# Patient Record
Sex: Male | Born: 1975 | Race: Black or African American | Hispanic: No | Marital: Single | Smoking: Never smoker
Health system: Southern US, Community
[De-identification: ages and names within clinical notes are randomized; demographics above are authoritative.]

## PROBLEM LIST (undated history)

## (undated) DIAGNOSIS — I639 Cerebral infarction, unspecified: Secondary | ICD-10-CM

## (undated) HISTORY — PX: ABDOMINAL SURGERY: SHX537

---

## 2001-09-25 ENCOUNTER — Encounter: Payer: Self-pay | Admitting: Emergency Medicine

## 2001-09-25 ENCOUNTER — Emergency Department (HOSPITAL_COMMUNITY): Admission: EM | Admit: 2001-09-25 | Discharge: 2001-09-25 | Payer: Self-pay | Admitting: Emergency Medicine

## 2003-05-20 ENCOUNTER — Emergency Department (HOSPITAL_COMMUNITY): Admission: EM | Admit: 2003-05-20 | Discharge: 2003-05-21 | Payer: Self-pay | Admitting: Emergency Medicine

## 2003-05-22 ENCOUNTER — Emergency Department (HOSPITAL_COMMUNITY): Admission: EM | Admit: 2003-05-22 | Discharge: 2003-05-22 | Payer: Self-pay

## 2003-05-25 ENCOUNTER — Emergency Department (HOSPITAL_COMMUNITY): Admission: EM | Admit: 2003-05-25 | Discharge: 2003-05-25 | Payer: Self-pay | Admitting: Emergency Medicine

## 2003-07-11 ENCOUNTER — Inpatient Hospital Stay (HOSPITAL_COMMUNITY): Admission: EM | Admit: 2003-07-11 | Discharge: 2003-07-17 | Payer: Self-pay | Admitting: Emergency Medicine

## 2003-07-16 ENCOUNTER — Encounter: Payer: Self-pay | Admitting: Surgery

## 2004-02-25 ENCOUNTER — Emergency Department (HOSPITAL_COMMUNITY): Admission: EM | Admit: 2004-02-25 | Discharge: 2004-02-25 | Payer: Self-pay | Admitting: Emergency Medicine

## 2018-01-22 ENCOUNTER — Other Ambulatory Visit: Payer: Self-pay

## 2018-01-22 ENCOUNTER — Emergency Department (HOSPITAL_COMMUNITY)

## 2018-01-22 ENCOUNTER — Encounter (HOSPITAL_COMMUNITY): Payer: Self-pay

## 2018-01-22 ENCOUNTER — Emergency Department (HOSPITAL_COMMUNITY)
Admission: EM | Admit: 2018-01-22 | Discharge: 2018-01-22 | Disposition: A | Discharge status: Home / Self Care | Attending: Emergency Medicine | Admitting: Emergency Medicine

## 2018-01-22 DIAGNOSIS — Y990 Civilian activity done for income or pay: Secondary | ICD-10-CM | POA: Insufficient documentation

## 2018-01-22 DIAGNOSIS — S29011A Strain of muscle and tendon of front wall of thorax, initial encounter: Secondary | ICD-10-CM | POA: Insufficient documentation

## 2018-01-22 DIAGNOSIS — S299XXA Unspecified injury of thorax, initial encounter: Secondary | ICD-10-CM | POA: Diagnosis present

## 2018-01-22 DIAGNOSIS — X501XXA Overexertion from prolonged static or awkward postures, initial encounter: Secondary | ICD-10-CM | POA: Insufficient documentation

## 2018-01-22 DIAGNOSIS — Y9259 Other trade areas as the place of occurrence of the external cause: Secondary | ICD-10-CM | POA: Insufficient documentation

## 2018-01-22 DIAGNOSIS — R0602 Shortness of breath: Secondary | ICD-10-CM | POA: Insufficient documentation

## 2018-01-22 DIAGNOSIS — Y9389 Activity, other specified: Secondary | ICD-10-CM | POA: Insufficient documentation

## 2018-01-22 MED ORDER — LIDOCAINE 5 % EX PTCH: 1.0000 | MEDICATED_PATCH | CUTANEOUS | 0 refills | Status: DC

## 2018-01-22 MED ORDER — METHOCARBAMOL 500 MG PO TABS: 500.0000 mg | ORAL_TABLET | Freq: Two times a day (BID) | ORAL | 0 refills | Status: DC

## 2018-01-22 MED ORDER — IBUPROFEN 600 MG PO TABS: 600.0000 mg | ORAL_TABLET | Freq: Four times a day (QID) | ORAL | 0 refills | Status: AC | PRN

## 2018-01-22 NOTE — ED Provider Notes (Signed)
[facility]-EMERGENCY DEPT Provider Note   CSN: [id number] Arrival date & time: 01/22/18  [id number]     History   Chief Complaint Chief Complaint  Patient presents with  . Rib Injury    Left  . Shortness of Breath    HPI Gerald Roy is a 42 y.o. male.  HPI   Gerald Roy is a 42 y.o. male, patient with no pertinent past medical history, presenting to the ED with left-sided rib pain that began shortly prior to arrival.  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  He was at work, leaning over a rail to pick up an object, felt a "pop" in the left ribs, and experienced immediate pain.  He complained of shortness of breath to the triage nurse, but he clarifies that he hesitates to take a deep breath due to his pain. Denies dizziness, hemoptysis, cough, falls, direct trauma, abdominal pain, nausea/vomiting, or any other complaints.   History reviewed. No pertinent past medical history.  There are no active problems to display for this patient.   Past Surgical History:  Procedure Laterality Date  . ABDOMINAL SURGERY         Home Medications    Prior to Admission medications   Medication Sig Start Date End Date Taking? Authorizing Provider  ibuprofen (ADVIL,MOTRIN) 600 MG tablet Take 1 tablet (600 mg total) by mouth every 6 (six) hours as needed. 01/22/18   Jennaya Pogue C, PA-C  lidocaine (LIDODERM) 5 % Place 1 patch onto the skin daily. Remove & Discard patch within 12 hours or as directed by MD 01/22/18   Harolyn RutherfordJoy, Charlisa Cham C, PA-C  methocarbamol (ROBAXIN) 500 MG tablet Take 1 tablet (500 mg total) by mouth 2 (two) times daily. 01/22/18   Anselm PancoastJoy, Aleecia Tapia C, PA-C    Family History History reviewed. No pertinent family history.  Social History Social History   Tobacco Use  . Smoking status: Never Smoker  . Smokeless tobacco: Never Used  Substance Use Topics  . Alcohol use: Not on file  . Drug use: No     Allergies   Sulfa antibiotics   Review of  Systems Review of Systems  Respiratory: Negative for cough and shortness of breath.   Gastrointestinal: Negative for abdominal pain, nausea and vomiting.  Musculoskeletal: Negative for back pain.       Left rib pain  All other systems reviewed and are negative.    Physical Exam Updated Vital Signs BP 114/78 (BP Location: Right Arm)   Pulse 78   Temp 97.7 F (36.5 C) (Oral)   Resp 18   Ht 6\' 3"  (1.905 m)   Wt 98.4 kg (217 lb)   SpO2 99%   BMI 27.12 kg/m   Physical Exam  Constitutional: He appears well-developed and well-nourished. No distress.  HENT:  Head: Normocephalic and atraumatic.  Eyes: Conjunctivae are normal.  Neck: Neck supple.  Cardiovascular: Normal rate, regular rhythm, normal heart sounds and intact distal pulses.  Pulmonary/Chest: Effort normal and breath sounds normal. No respiratory distress. He has no decreased breath sounds.  Tenderness to left anterior lateral ribs without noted deformity, crepitus, instability, bruising, or swelling.  Equal chest rise and fall. Patient does have apparent pain with any movement of the torso.    Abdominal: Soft. There is no tenderness. There is no guarding.  Musculoskeletal: He exhibits no edema.  Lymphadenopathy:    He has no cervical adenopathy.  Neurological: He is alert.  Skin: Skin is warm and  dry. He is not diaphoretic.  Psychiatric: He has a normal mood and affect. His behavior is normal.  Nursing note and vitals reviewed.    ED Treatments / Results  Labs (all labs ordered are listed, but only abnormal results are displayed) Labs Reviewed - No data to display  EKG  EKG Interpretation  Date/Time:  Tuesday January 22 2018 02:55:43 EST Ventricular Rate:  82 PR Interval:    QRS Duration: 90 QT Interval:  361 QTC Calculation: 422 R Axis:   81 Text Interpretation:  Sinus rhythm No significant change since last tracing other than rate is slower Confirmed by Rochele Raring [phone]) on 01/22/2018 3:01:20 AM        Radiology Dg Chest 2 View  Result Date: 01/22/2018 CLINICAL DATA:  42 year old male with shortness of breath. EXAM: CHEST  2 VIEW COMPARISON:  None. FINDINGS: The heart size and mediastinal contours are within normal limits. Both lungs are clear. The visualized skeletal structures are unremarkable. IMPRESSION: No active cardiopulmonary disease. Electronically Signed   By: Elgie Collard M.D.   On: 01/22/2018 03:23    Procedures Procedures (including critical care time)  Medications Ordered in ED Medications - No data to display   Initial Impression / Assessment and Plan / ED Course  I have reviewed the triage vital signs and the nursing notes.  Pertinent labs & imaging results that were available during my care of the patient were reviewed by me and considered in my medical decision making (see chart for details).     Patient presents with left rib pain.  No acute abnormalities on chest x-ray.  Equal chest rise and fall.  Lung sounds clear and equal.  Maintains adequate SPO2 on room air.  No tachycardia.  No signs of distress.  Low suspicion for lung injury based on my findings during this visit.  Pain management and PCP follow-up for any further evaluation. The patient was given instructions for home care as well as return precautions. Patient voices understanding of these instructions, accepts the plan, and is comfortable with discharge.  Vitals:   01/22/18 0254 01/22/18 0258 01/22/18 0508  BP: 129/80  114/78  Pulse: 83  78  Resp: 16  18  Temp: 97.7 F (36.5 C)    TempSrc: Oral    SpO2: 96%  99%  Weight:  98.4 kg (217 lb)   Height:  6\' 3"  (1.905 m)      Final Clinical Impressions(s) / ED Diagnoses   Final diagnoses:  Intercostal muscle strain, initial encounter    ED Discharge Orders        Ordered    ibuprofen (ADVIL,MOTRIN) 600 MG tablet  Every 6 hours PRN     01/22/18 0453    methocarbamol (ROBAXIN) 500 MG tablet  2 times daily     01/22/18 0453     lidocaine (LIDODERM) 5 %  Every 24 hours     01/22/18 0453       Anselm Pancoast, PA-C 01/22/18 0610    Ward, Layla Maw, DO 01/22/18 [phone]

## 2018-01-22 NOTE — ED Triage Notes (Signed)
Pt reports feeling a "pop" in his left rib cage area while reaching over a rail for an object at work. Pt reports left side rib cage pain and sob. Pt denies hx of smoking. Pt A+OX4, NAD, speaking in complete sentences, ambulatory to triage.

## 2018-01-22 NOTE — ED Notes (Signed)
Bed: WLPT2 Expected date:  Expected time:  Means of arrival:  Comments: 

## 2018-01-22 NOTE — Discharge Instructions (Signed)
Expect your soreness to increase over the next 2-3 days. Take it easy, but do not lay around too much as this may make any stiffness worse.  Antiinflammatory medications: Take 600 mg of ibuprofen every 6 hours or 440 mg (over the counter dose) to 500 mg (prescription dose) of naproxen every 12 hours for the next 3 days. After this time, these medications may be used as needed for pain. Take these medications with food to avoid upset stomach. Choose only one of these medications, do not take them together.  Tylenol: Should you continue to have additional pain while taking the ibuprofen or naproxen, you may add in tylenol as needed. Your daily total maximum amount of tylenol from all sources should be limited to 4000mg /day for persons without liver problems, or 2000mg /day for those with liver problems. Muscle relaxer: Robaxin is a muscle relaxer and may help loosen stiff muscles. Do not take the Robaxin while driving or performing other dangerous activities.  Lidocaine patches: These are available via either prescription or over-the-counter. The over-the-counter option may be more economical one and are likely just as effective. There are multiple over-the-counter brands, such as Salonpas. Exercises: Be sure to perform the attached exercises starting with three times a week and working up to performing them daily. This is an essential part of preventing long term problems.  Incentive spirometer: Please use the incentive spirometer about every hour or 2 throughout the day.  Have an initial goal of 1000 mL and then increase from there.  Follow up with a primary care provider for any future management of these complaints.

## 2018-09-21 IMAGING — CR DG CHEST 2V
3 series · 3 of 3 positions shown · non-contrast
Comparison: None.

CLINICAL DATA: 41-year-old male with shortness of breath.

EXAM:
CHEST  2 VIEW

[w chest pa (1 of 2)]
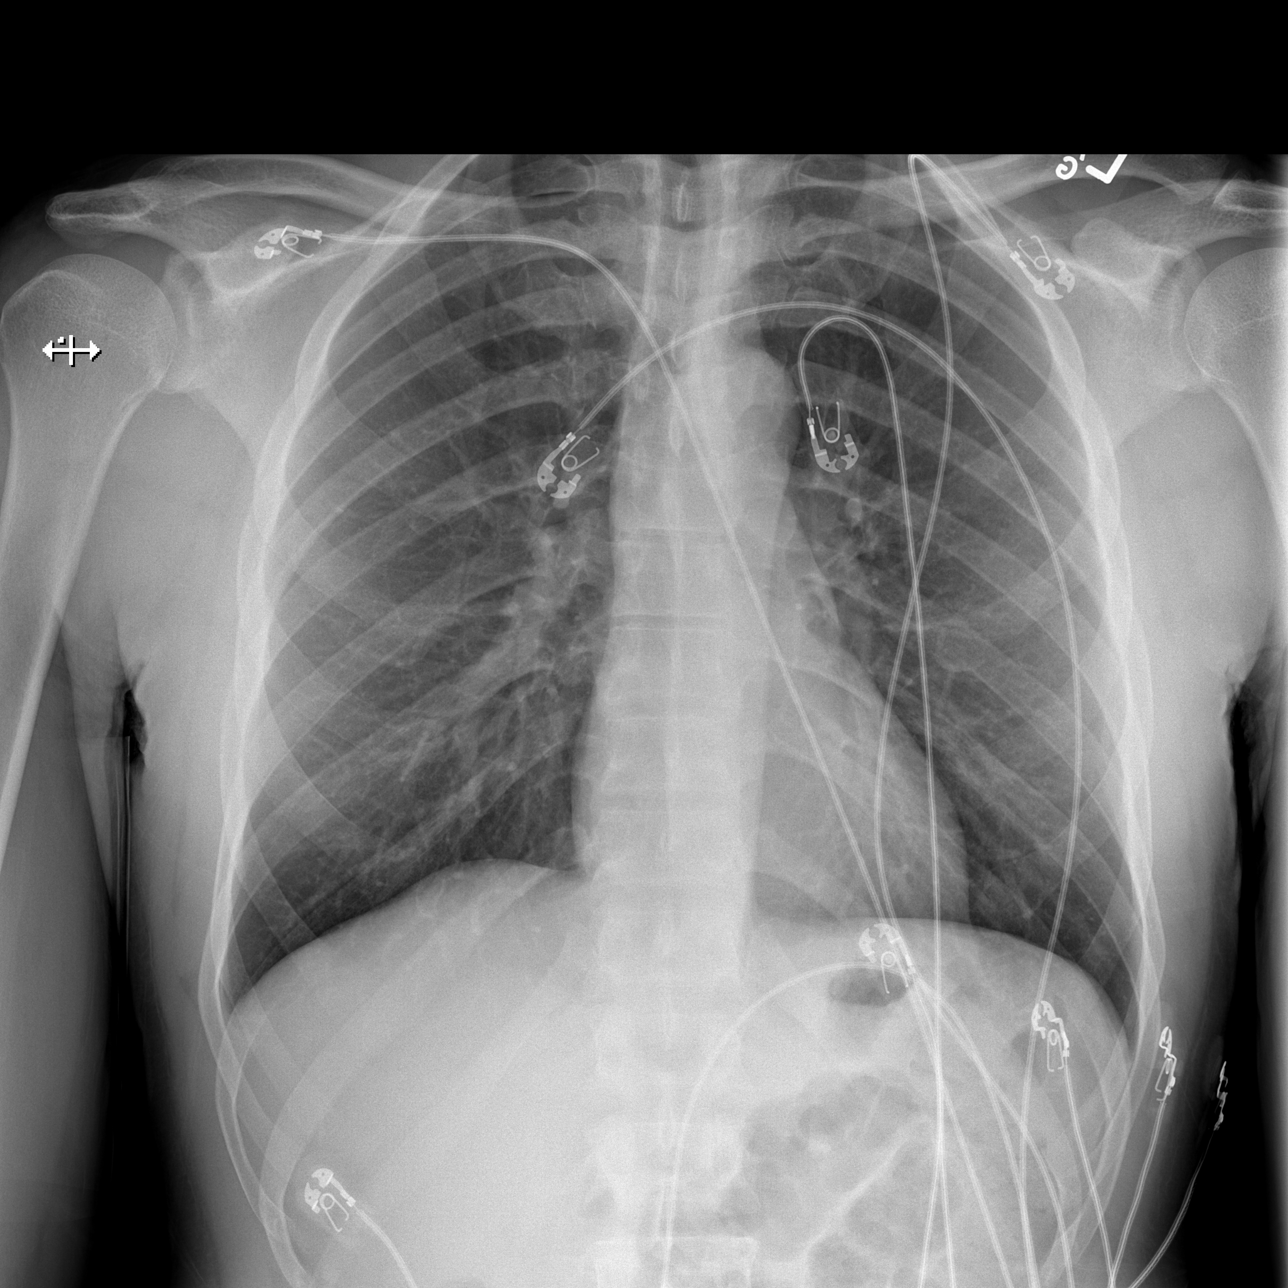

[w chest pa (2 of 2)]
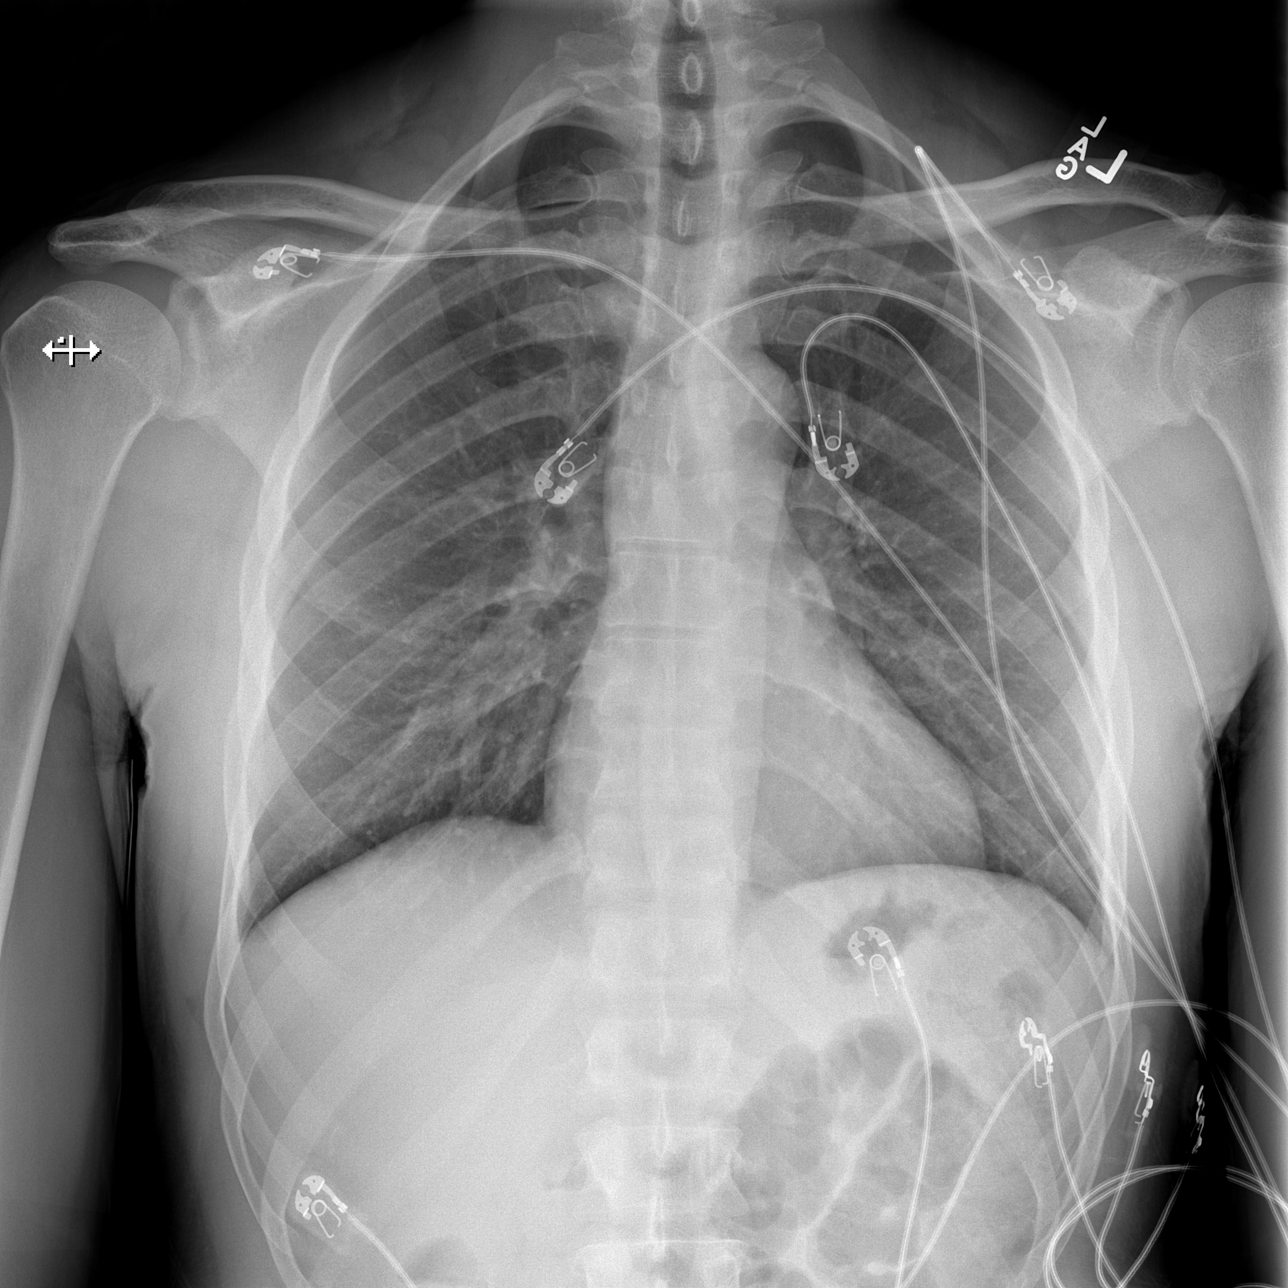

[w chest lat]
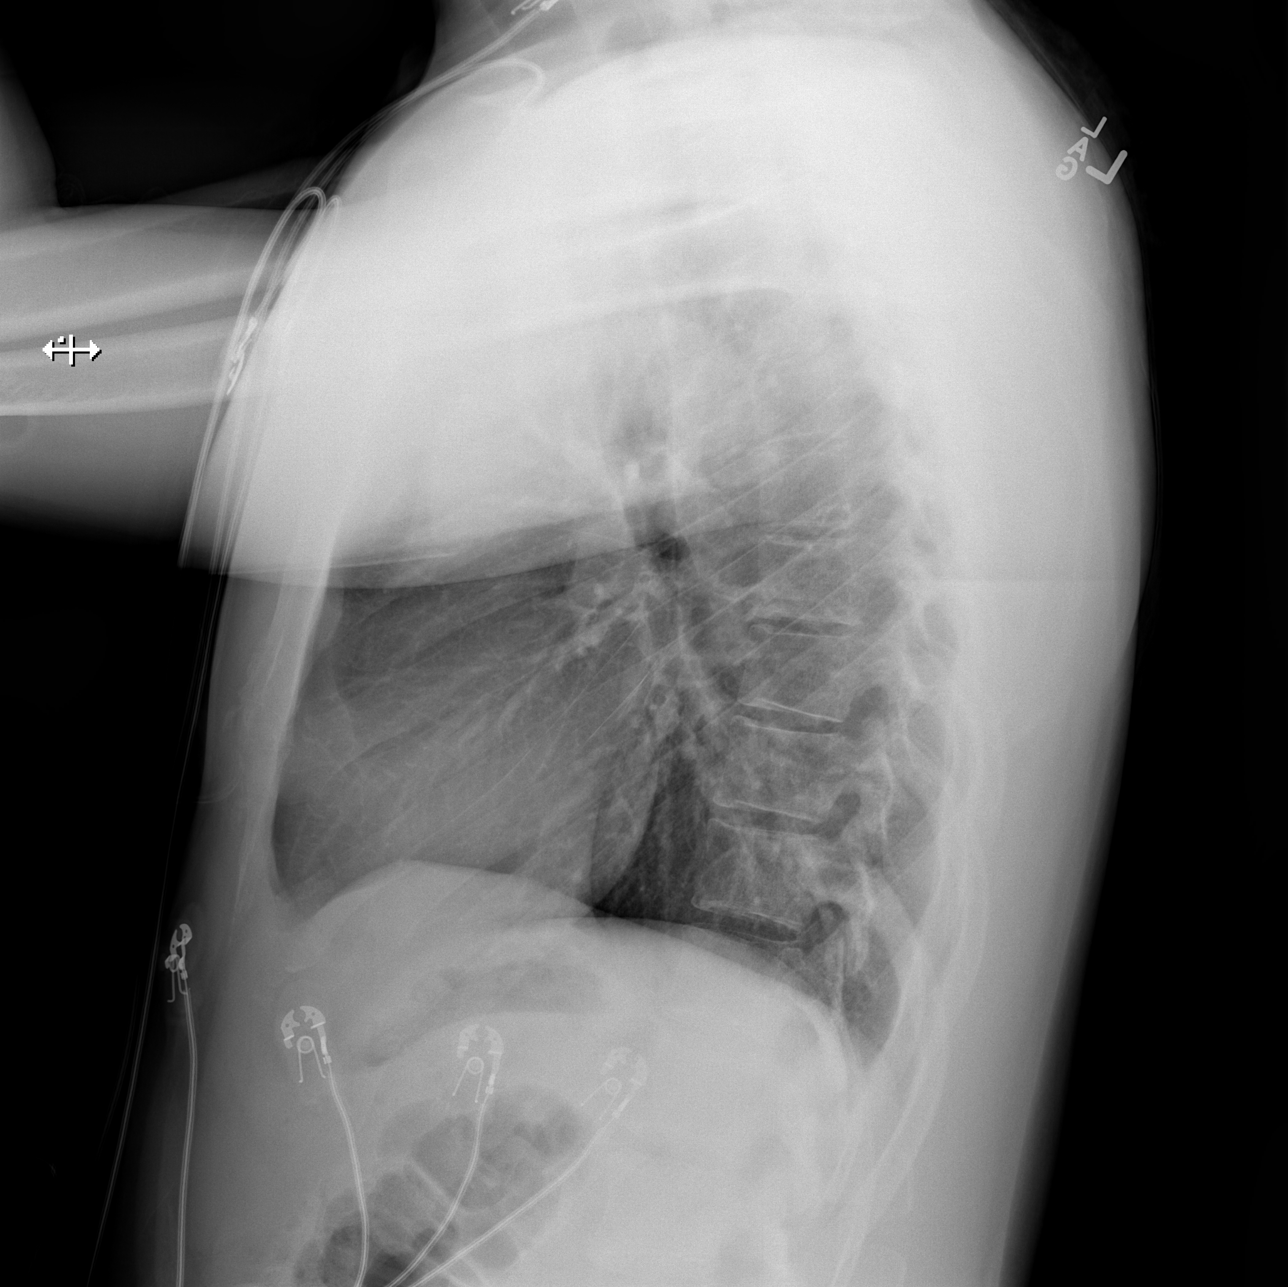

[3 of 3 positions shown; findings below may reference images not displayed]

FINDINGS: The heart size and mediastinal contours are within normal limits.
Both lungs are clear. The visualized skeletal structures are
unremarkable.
IMPRESSION: No active cardiopulmonary disease.

## 2020-04-06 ENCOUNTER — Encounter (HOSPITAL_COMMUNITY): Payer: Self-pay

## 2020-04-06 ENCOUNTER — Emergency Department (HOSPITAL_COMMUNITY)
Admission: EM | Admit: 2020-04-06 | Discharge: 2020-04-06 | Disposition: A | Discharge status: Home / Self Care | Attending: Emergency Medicine | Admitting: Emergency Medicine

## 2020-04-06 ENCOUNTER — Other Ambulatory Visit: Payer: Self-pay

## 2020-04-06 DIAGNOSIS — Z8673 Personal history of transient ischemic attack (TIA), and cerebral infarction without residual deficits: Secondary | ICD-10-CM | POA: Diagnosis not present

## 2020-04-06 DIAGNOSIS — R200 Anesthesia of skin: Secondary | ICD-10-CM | POA: Insufficient documentation

## 2020-04-06 DIAGNOSIS — M545 Low back pain: Secondary | ICD-10-CM | POA: Diagnosis present

## 2020-04-06 DIAGNOSIS — M5441 Lumbago with sciatica, right side: Secondary | ICD-10-CM | POA: Diagnosis not present

## 2020-04-06 HISTORY — DX: Cerebral infarction, unspecified: I63.9

## 2020-04-06 MED ORDER — METHOCARBAMOL 750 MG PO TABS: 750.0000 mg | ORAL_TABLET | Freq: Every evening | ORAL | 0 refills | Status: AC | PRN

## 2020-04-06 MED ORDER — NAPROXEN 500 MG PO TABS: 500.0000 mg | ORAL_TABLET | Freq: Two times a day (BID) | ORAL | 0 refills | Status: AC

## 2020-04-06 MED ORDER — LIDOCAINE 5 % EX PTCH: 1.0000 | MEDICATED_PATCH | CUTANEOUS | 0 refills | Status: AC

## 2020-04-06 NOTE — ED Provider Notes (Signed)
[facility] DEPT Provider Note   CSN: [id number] Arrival date & time: 04/06/20  0357     History Chief Complaint  Patient presents with  . Back Pain    Gerald Roy is a 44 y.o. male.  HPI   44 year old male with history of CVA who presents emergency department today complaining of right lower back pain that started about a week ago.  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  He was seen at an Ortho urgent care and told that he had a herniated disks.  He was given a course of prednisone which she states did improve symptoms somewhat however he returned to work today and his pain was significantly worse when he tried to return to his activities.  He is now having pain that radiates down the right lower extremity.  Pain is constant and severe in nature.  It is worse with certain positions and movements.  He has intermittent numbness of the leg but denies any weakness.  He has been ambulatory.  Denies saddle anesthesia. Denies loss of control of bowels or bladder. No urinary retention. No fevers. Denies a h/o IVDU. Denies a h/o CA.   Past Medical History:  Diagnosis Date  . Stroke [facility])     There are no problems to display for this patient.   Past Surgical History:  Procedure Laterality Date  . ABDOMINAL SURGERY         No family history on file.  Social History   Tobacco Use  . Smoking status: Never Smoker  . Smokeless tobacco: Never Used  Substance Use Topics  . Alcohol use: Not on file  . Drug use: No    Home Medications Prior to Admission medications   Medication Sig Start Date End Date Taking? Authorizing Provider  ibuprofen (ADVIL,MOTRIN) 600 MG tablet Take 1 tablet (600 mg total) by mouth every 6 (six) hours as needed. 01/22/18   Joy, Shawn C, PA-C  lidocaine (LIDODERM) 5 % Place 1 patch onto the skin daily. Remove & Discard patch within 12 hours or as directed by MD 04/06/20    Shanele Nissan S, PA-C  methocarbamol (ROBAXIN) 750 MG tablet Take 1 tablet (750 mg total) by mouth at bedtime as needed for up to 5 days for muscle spasms. 04/06/20 04/11/20  Maliq Pilley S, PA-C  naproxen (NAPROSYN) 500 MG tablet Take 1 tablet (500 mg total) by mouth 2 (two) times daily. 04/06/20   Landis Dowdy S, PA-C    Allergies    Sulfa antibiotics, Lactose, and Other  Review of Systems   Review of Systems  Genitourinary:       No loss of control of bowel or bladder function  Musculoskeletal: Positive for back pain.  Neurological: Positive for numbness. Negative for weakness.    Physical Exam Updated Vital Signs BP (!) 128/93   Pulse 86   Temp 97.9 F (36.6 C) (Oral)   Resp 18   Ht 6\' 3"  (1.905 m)   Wt 102.1 kg   SpO2 99%   BMI 28.12 kg/m   Physical Exam Constitutional:      General: He is not in acute distress.    Appearance: He is well-developed.  Eyes:     Conjunctiva/sclera: Conjunctivae normal.  Cardiovascular:     Rate and Rhythm: Normal rate and regular rhythm.  Pulmonary:     Effort: Pulmonary effort is normal.  Breath sounds: Normal breath sounds.  Musculoskeletal:       Back:     Comments: TTP to the areas indicated above 5/5 strength to BLE with flexion/extension/abduction/adduction at hips, flexion/extension at knees, and dorsiflexion/plantarflexion of feet. Sensation intact and symmetric bilaterally. Ambulatory with limp  Skin:    General: Skin is warm and dry.  Neurological:     Mental Status: He is alert and oriented to person, place, and time.     ED Results / Procedures / Treatments   Labs (all labs ordered are listed, but only abnormal results are displayed) Labs Reviewed - No data to display  EKG None  Radiology No results found.  Procedures Procedures (including critical care time)  Medications Ordered in ED Medications - No data to display  ED Course  I have reviewed the triage vital signs and the nursing  notes.  Pertinent labs & imaging results that were available during my care of the patient were reviewed by me and considered in my medical decision making (see chart for details).    MDM Rules/Calculators/A&P                      Patient with back pain.  No neurological deficits and normal neuro exam.  Patient can walk but states is painful.  No loss of bowel or bladder control.  No concern for cauda equina.  No fever, night sweats, weight loss, h/o cancer, IVDU.  RICE protocol and pain medicine indicated and discussed with patient. Neurosurgery f/u given. Advised on return precautions. He voices understanding and is in agreement with plan. All questions answered, pt stable for d/c.    Final Clinical Impression(s) / ED Diagnoses Final diagnoses:  Acute right-sided low back pain with right-sided sciatica    Rx / DC Orders ED Discharge Orders         Ordered    naproxen (NAPROSYN) 500 MG tablet  2 times daily     04/06/20 0825    methocarbamol (ROBAXIN) 750 MG tablet  At bedtime PRN     04/06/20 0825    lidocaine (LIDODERM) 5 %  Every 24 hours     04/06/20 0825           Saarah Dewing, [employer], PA-C 04/06/20 4782    Cathren Laine, MD 04/06/20 1550

## 2020-04-06 NOTE — Discharge Instructions (Signed)
You may alternate taking Tylenol and Naproxen as needed for pain control. You may take Naproxen twice daily as directed on your discharge paperwork and you may take  [phone] mg of Tylenol every 6 hours. Do not exceed 4000 mg of Tylenol daily as this can lead to liver damage. Also, make sure to take Naproxen with meals as it can cause an upset stomach. Do not take other NSAIDs while taking Naproxen such as (Aleve, Ibuprofen, Aspirin, Celebrex, etc) and do not take more than the prescribed dose as this can lead to ulcers and bleeding in your GI tract. You may use warm and cold compresses to help with your symptoms.   You were given a prescription for Robaxin which is a muscle relaxer.  You should not drive, work, or operate machinery while taking this medication as it can make you very drowsy.    Please follow up with the back doctor (neurosurgeon) in the next 7-10 days for re-evaluation and further treatment of your symptoms.   Return to the emergency department immediately if you experience any back pain associated with fevers, loss of control of your bowels/bladder, weakness/numbness to your legs, numbness to your groin area, inability to walk, or inability to urinate.

## 2020-04-06 NOTE — ED Triage Notes (Signed)
Lower back pain. Dx of bulging disc. Pain shooting down right leg.
